# Patient Record
Sex: Male | Born: 1985 | Race: White | Hispanic: No | Smoking: Current every day smoker
Health system: Southern US, Community
[De-identification: ages and names within clinical notes are randomized; demographics above are authoritative.]

---

## 2017-08-27 ENCOUNTER — Emergency Department (HOSPITAL_COMMUNITY): Payer: Self-pay

## 2017-08-27 ENCOUNTER — Encounter (HOSPITAL_COMMUNITY): Payer: Self-pay | Admitting: *Deleted

## 2017-08-27 ENCOUNTER — Emergency Department (HOSPITAL_COMMUNITY)
Admission: EM | Admit: 2017-08-27 | Discharge: 2017-08-27 | Disposition: A | Discharge status: Home / Self Care | Payer: Self-pay | Attending: Physician Assistant | Admitting: Physician Assistant

## 2017-08-27 DIAGNOSIS — R109 Unspecified abdominal pain: Secondary | ICD-10-CM | POA: Insufficient documentation

## 2017-08-27 DIAGNOSIS — I4891 Unspecified atrial fibrillation: Secondary | ICD-10-CM | POA: Insufficient documentation

## 2017-08-27 DIAGNOSIS — F17210 Nicotine dependence, cigarettes, uncomplicated: Secondary | ICD-10-CM | POA: Insufficient documentation

## 2017-08-27 DIAGNOSIS — R112 Nausea with vomiting, unspecified: Secondary | ICD-10-CM | POA: Insufficient documentation

## 2017-08-27 LAB — CBC WITH DIFFERENTIAL/PLATELET
BASOS ABS: 0 10*3/uL (ref 0.0–0.1)
BASOS PCT: 0 %
EOS ABS: 0.3 10*3/uL (ref 0.0–0.7)
EOS PCT: 2 %
HCT: 45.4 % (ref 39.0–52.0)
Hemoglobin: 16.1 g/dL (ref 13.0–17.0)
Lymphocytes Relative: 14 %
Lymphs Abs: 1.7 10*3/uL (ref 0.7–4.0)
MCH: 29.9 pg (ref 26.0–34.0)
MCHC: 35.5 g/dL (ref 30.0–36.0)
MCV: 84.4 fL (ref 78.0–100.0)
MONOS PCT: 11 %
Monocytes Absolute: 1.3 10*3/uL — ABNORMAL HIGH (ref 0.1–1.0)
Neutro Abs: 8.8 10*3/uL — ABNORMAL HIGH (ref 1.7–7.7)
Neutrophils Relative %: 73 %
PLATELETS: 236 10*3/uL (ref 150–400)
RBC: 5.38 MIL/uL (ref 4.22–5.81)
RDW: 12.8 % (ref 11.5–15.5)
WBC: 12.1 10*3/uL — AB (ref 4.0–10.5)

## 2017-08-27 LAB — URINALYSIS, ROUTINE W REFLEX MICROSCOPIC
BILIRUBIN URINE: NEGATIVE
Glucose, UA: NEGATIVE mg/dL
HGB URINE DIPSTICK: NEGATIVE
Ketones, ur: 20 mg/dL — AB
Leukocytes, UA: NEGATIVE
NITRITE: NEGATIVE
PH: 6 (ref 5.0–8.0)
Protein, ur: NEGATIVE mg/dL
SPECIFIC GRAVITY, URINE: 1.012 (ref 1.005–1.030)

## 2017-08-27 LAB — PROTIME-INR
INR: 0.89
Prothrombin Time: 12 seconds (ref 11.4–15.2)

## 2017-08-27 LAB — RAPID URINE DRUG SCREEN, HOSP PERFORMED
AMPHETAMINES: NOT DETECTED
BARBITURATES: NOT DETECTED
Benzodiazepines: NOT DETECTED
COCAINE: NOT DETECTED
OPIATES: NOT DETECTED
TETRAHYDROCANNABINOL: POSITIVE — AB

## 2017-08-27 LAB — COMPREHENSIVE METABOLIC PANEL
ALBUMIN: 4.3 g/dL (ref 3.5–5.0)
ALT: 14 U/L — ABNORMAL LOW (ref 17–63)
ANION GAP: 9 (ref 5–15)
AST: 20 U/L (ref 15–41)
Alkaline Phosphatase: 61 U/L (ref 38–126)
BUN: 11 mg/dL (ref 6–20)
CO2: 27 mmol/L (ref 22–32)
Calcium: 10.6 mg/dL — ABNORMAL HIGH (ref 8.9–10.3)
Chloride: 100 mmol/L — ABNORMAL LOW (ref 101–111)
Creatinine, Ser: 0.86 mg/dL (ref 0.61–1.24)
GFR calc Af Amer: >60 mL/min (ref >60)
GLUCOSE: 112 mg/dL — AB (ref 65–99)
POTASSIUM: 3.4 mmol/L — AB (ref 3.5–5.1)
Sodium: 136 mmol/L (ref 135–145)
TOTAL PROTEIN: 7.1 g/dL (ref 6.5–8.1)
Total Bilirubin: 0.8 mg/dL (ref 0.3–1.2)

## 2017-08-27 LAB — I-STAT TROPONIN, ED: TROPONIN I, POC: 0 ng/mL (ref 0.00–0.08)

## 2017-08-27 LAB — I-STAT CG4 LACTIC ACID, ED: Lactic Acid, Venous: 1.54 mmol/L (ref 0.5–1.9)

## 2017-08-27 LAB — LIPASE, BLOOD: Lipase: 25 U/L (ref 11–51)

## 2017-08-27 LAB — D-DIMER, QUANTITATIVE: D-Dimer, Quant: 0.27 ug/mL-FEU (ref 0.00–0.50)

## 2017-08-27 MED ORDER — DILTIAZEM HCL 25 MG/5ML IV SOLN
10.0000 mg | Freq: Once | INTRAVENOUS | Status: DC
  Filled 2017-08-27: qty 5

## 2017-08-27 MED ORDER — IOPAMIDOL (ISOVUE-300) INJECTION 61%
INTRAVENOUS | Status: AC
  Administered 2017-08-27: 100 mL
  Filled 2017-08-27: qty 100

## 2017-08-27 MED ORDER — DILTIAZEM HCL 100 MG IV SOLR
5.0000 mg/h | INTRAVENOUS | Status: DC
  Filled 2017-08-27: qty 100

## 2017-08-27 MED ORDER — SODIUM CHLORIDE 0.9 % IV BOLUS (SEPSIS)
1000.0000 mL | Freq: Once | INTRAVENOUS | Status: AC
  Administered 2017-08-27: 1000 mL via INTRAVENOUS

## 2017-08-27 MED ORDER — LEVALBUTEROL HCL 0.63 MG/3ML IN NEBU
0.6300 mg | INHALATION_SOLUTION | Freq: Once | RESPIRATORY_TRACT | Status: AC
  Administered 2017-08-27: 0.63 mg via RESPIRATORY_TRACT
  Filled 2017-08-27: qty 3

## 2017-08-27 NOTE — ED Provider Notes (Signed)
[facility] EMERGENCY DEPARTMENT Provider Note   CSN: [id number] Arrival date & time: 08/27/17  0909     History   Chief Complaint No chief complaint on file.   HPI Akhilesh Sassone is a 31 y.o. male.  HPI   Patient is a 31 year old male presenting with multiple complaints. Patient reports that he has abdominal pain has felt faint, has had chest pain, jaw pain, nausea, vomiting. Patient eports has not had follow-up in the last year. He reports over 20 visits to emergency department for abdominal pain workup in the past.he reports that there is some artery leading to his stomach which is mildly narrowed they think that may be causing his chronic abdominal pain. 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 He says he started vomiting this morning and felt like he "could have infection in his blood". So he called EMS to be brought to the emergency department.  On arrival patient noted to be in A. Fib. Patient denies any kind of cardiac history.  History reviewed. No pertinent past medical history.  There are no active problems to display for this patient.   History reviewed. No pertinent surgical history.     Home Medications    Prior to Admission medications   Not on File    Family History No family history on file.  Social History Social History  Substance Use Topics  . Smoking status: Current Every Day Smoker    Packs/day: 0.50    Types: Cigarettes  . Smokeless tobacco: Never Used  . Alcohol use No     Allergies   Patient has no known allergies.   Review of Systems Review of Systems  Constitutional: Positive for fatigue. Negative for activity change.  Respiratory: Positive for cough and shortness of breath.   Cardiovascular: Positive for chest pain.  Gastrointestinal: Positive for abdominal pain.  Genitourinary: Positive for dysuria.  Musculoskeletal: Negative for neck pain  and neck stiffness.  Skin: Negative for rash.  Neurological: Positive for dizziness, syncope, weakness and headaches.     Physical Exam Updated Vital Signs BP 107/63   Pulse 69   Temp 98.3 F (36.8 C) (Oral)   Resp 14   SpO2 96%   Physical Exam  Constitutional: He is oriented to person, place, and time. He appears well-developed and well-nourished.  Diffuse tattoos.  HENT:  Head: Normocephalic and atraumatic.  Eyes: Conjunctivae are normal. Right eye exhibits no discharge. Left eye exhibits no discharge.  Neck: Neck supple.  Cardiovascular:  No murmur heard. Irregularly irregular nno murmurs.  Pulmonary/Chest: Effort normal. No respiratory distress.  Course breath sounds with scattered wheezes.  Abdominal: Soft. There is tenderness.  Diffuse tenderness nonfocal.  Musculoskeletal: He exhibits no edema.  Neurological: He is alert and oriented to person, place, and time. No cranial nerve deficit.  Skin: Skin is warm and dry.  Psychiatric:  Patient anxious  Nursing note and vitals reviewed.    ED Treatments / Results  Labs (all labs ordered are listed, but only abnormal results are displayed) Labs Reviewed  COMPREHENSIVE METABOLIC PANEL - Abnormal; Notable for the following:       Result Value   Potassium 3.4 (*)    Chloride 100 (*)    Glucose, Bld 112 (*)    Calcium 10.6 (*)    ALT 14 (*)    All other components within normal limits  CBC WITH DIFFERENTIAL/PLATELET - Abnormal; Notable for  the following:    WBC 12.1 (*)    Neutro Abs 8.8 (*)    Monocytes Absolute 1.3 (*)    All other components within normal limits  URINALYSIS, ROUTINE W REFLEX MICROSCOPIC - Abnormal; Notable for the following:    Ketones, ur 20 (*)    All other components within normal limits  RAPID URINE DRUG SCREEN, HOSP PERFORMED - Abnormal; Notable for the following:    Tetrahydrocannabinol POSITIVE (*)    All other components within normal limits  PROTIME-INR  LIPASE, BLOOD  D-DIMER,  QUANTITATIVE (NOT AT [facility])  I-STAT CG4 LACTIC ACID, ED  I-STAT TROPONIN, ED    EKG  EKG Interpretation  Date/Time:  Wednesday August 27 2017 09:09:45 EDT Ventricular Rate:  158 PR Interval:    QRS Duration: 86 QT Interval:  284 QTC Calculation: 461 R Axis:   95 Text Interpretation:  Atrial fibrillation Borderline right axis deviation Atrial fibrillation Confirmed by Corlis Leak, Joana Nolton ([id number]) on 08/27/2017 9:36:30 AM       Radiology Ct Abdomen Pelvis W Contrast  Result Date: 08/27/2017 CLINICAL DATA:  Abdominal pain, nausea/vomiting EXAM: CT ABDOMEN AND PELVIS WITH CONTRAST TECHNIQUE: Multidetector CT imaging of the abdomen and pelvis was performed using the standard protocol following bolus administration of intravenous contrast. CONTRAST:  100 mL Isovue 300 IV COMPARISON:  None. FINDINGS: Lower chest: Lung bases are clear. Hepatobiliary: Liver is within normal limits. Gallbladder is underdistended but unremarkable. Pancreas: Within normal limits. Spleen: Within normal limits. Adrenals/Urinary Tract: Adrenal glands are within normal limits. Kidneys within normal limits.  No hydronephrosis. Bladder is underdistended but unremarkable. Stomach/Bowel: Stomach is within normal limits. No evidence bowel obstruction. Normal appendix (coronal image 45). Vascular/Lymphatic: No evidence of abdominal aortic aneurysm. Celiac artery and SMA are patent. No findings to suggest median arcuate ligament compression. Circumaortic left renal vein. No suspicious abdominopelvic lymphadenopathy. Reproductive: Prostate gland is notable for mildly heterogeneous enhancement. Other: No abdominopelvic ascites. Musculoskeletal: Visualized osseous structures are within normal limits. IMPRESSION: Negative CT abdomen/ pelvis. No evidence of bowel obstruction.  Normal appendix. Electronically Signed   By: Charline Bills M.D.   On: 08/27/2017 11:56   Dg Chest Portable 1 View  Result Date: 08/27/2017 CLINICAL DATA:   Recurrent syncopal episodes over the past 4 days. Cough, congestion shortness of breath. EXAM: PORTABLE CHEST 1 VIEW COMPARISON:  None. FINDINGS: Lungs are clear. Heart size is normal. No pneumothorax or pleural effusion. No bony abnormality. Defibrillator pads are noted. IMPRESSION: Negative chest. Electronically Signed   By: Drusilla Kanner M.D.   On: 08/27/2017 09:45    Procedures Procedures (including critical care time)  CRITICAL CARE Performed by: Arlana Hove Total critical care time: 45 minutes Critical care time was exclusive of separately billable procedures and treating other patients. Critical care was necessary to treat or prevent imminent or life-threatening deterioration. Critical care was time spent personally by me on the following activities: development of treatment plan with patient and/or surrogate as well as nursing, discussions with consultants, evaluation of patient's response to treatment, examination of patient, obtaining history from patient or surrogate, ordering and performing treatments and interventions, ordering and review of laboratory studies, ordering and review of radiographic studies, pulse oximetry and re-evaluation of patient's condition.    Medications Ordered in ED Medications  diltiazem (CARDIZEM) 100 mg in dextrose 5 % 100 mL (1 mg/mL) infusion (5 mg/hr Intravenous Not Given 08/27/17 1338)  sodium chloride 0.9 % bolus 1,000 mL (0 mLs Intravenous Stopped 08/27/17 0945)  sodium  chloride 0.9 % bolus 1,000 mL (0 mLs Intravenous Stopped 08/27/17 1338)  levalbuterol (XOPENEX) nebulizer solution 0.63 mg (0.63 mg Nebulization Given 08/27/17 1028)  iopamidol (ISOVUE-300) 61 % injection (100 mLs  Contrast Given 08/27/17 1117)     Initial Impression / Assessment and Plan / ED Course  I have reviewed the triage vital signs and the nursing notes.  Pertinent labs & imaging results that were available during my care of the patient were reviewed by me  and considered in my medical decision making (see chart for details).     Patient is a 31 year old male presenting with multiple complaints. Patient reports that he has abdominal pain has felt faint, has had chest pain, jaw pain, nausea, vomiting. Patient eports has not had follow-up in the last year. He reports over 20 visits to emergency department for abdominal pain workup in the past.he reports that there is some artery leading to his stomach which is mildly narrowed they think that may be causing his chronic abdominal pain. 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 He says he started vomiting this morning and felt like he "could have infection in his blood". So he called EMS to be brought to the emergency department.  On arrival patient noted to be in A. Fib. Patient denies any kind of cardiac history.   9:36 AM Patient has pan positive on review of systems is difficult to nail down what exactly brought him in today. However patient does have A. Fib with RVR on arrival. EMS reported a heart rate of 220. On arrival here is more like 130s to 160s. We will start dilt drip given initial blood pressure of 80s over 40s. Blood pressure improved to 130/80 with just 200 mL of fluid  1:10 PM  Pt now in sinus rtyhym. After only receiving fluids.  Unsure this patient is going in and out of A. Fib which is causing him to have these feelings of lightheadedness at home.   Patient has no cardiac risk factors ChadS vasc score 0. We'll touch base with cardiology.  Discussed cardiology. Will not give any aspirin or anicaogulation. We'll have him follow up with cardiology as an outpatient.  Final Clinical Impressions(s) / ED Diagnoses   Final diagnoses:  None    New Prescriptions New Prescriptions   No medications on file     Abelino DerrickMackuen, Caelum Federici Lyn, MD 08/27/17 1352

## 2017-08-27 NOTE — ED Notes (Signed)
HR 80 and irregular, MacKuen, MD informed, plan to hold Cardiazem until post infusion of 2nd NS bolus, will continue to monitor

## 2017-08-27 NOTE — ED Triage Notes (Signed)
[facility], per report pt has been sick x5-7 days, reports chills when  He lost power over the weekend and has been vomiting with one episode of dark blood in emesis, pt reports near syncopal episodes chronically, and x5 syncopal episodes of syncope resulting in falls and hitting his head, pt MAE, pt HR 170-200 pta, pt arrives to ED hypotensive, pt A&O x4

## 2017-08-27 NOTE — Discharge Instructions (Signed)
Please follow up with cardiology. They may want to be on a Holter monitor and do an echo of your heart. Please follow-up with  a primary care physician. We'll provide any phone numbers for each.

## 2017-09-01 ENCOUNTER — Telehealth (HOSPITAL_COMMUNITY): Payer: Self-pay | Admitting: *Deleted

## 2017-09-01 NOTE — Telephone Encounter (Signed)
I contacted pt to offer follow up to afib clinic since in the ED for afib with RVR.   Pt declined appt stating that he has no insurance.  I advised pt importance of [facility] just to make sure his is still in rhythm and to make sure he is no longer dehydrated.  Pt was given fluids in the hospital.  Pt understood but was dismissive that this would be an issue

## 2019-03-17 IMAGING — CT CT ABD-PELV W/ CM
2 of 4 series · 16 of 46 positions shown, 18 images · IV contrast (isovue)
Comparison: None.

CLINICAL DATA: Abdominal pain, nausea/vomiting

EXAM:
CT ABDOMEN AND PELVIS WITH CONTRAST
TECHNIQUE: Multidetector CT imaging of the abdomen and pelvis was performed
using the standard protocol following bolus administration of
intravenous contrast.
CONTRAST:  100 mL Isovue 300 IV

[Series 3: a/p w/ 5mm · axial · 0.76mm/px · z∈[+761,+1171]mm · 13 of 90 slices shown, 15 images]
[im 4/90  soft-tissue]
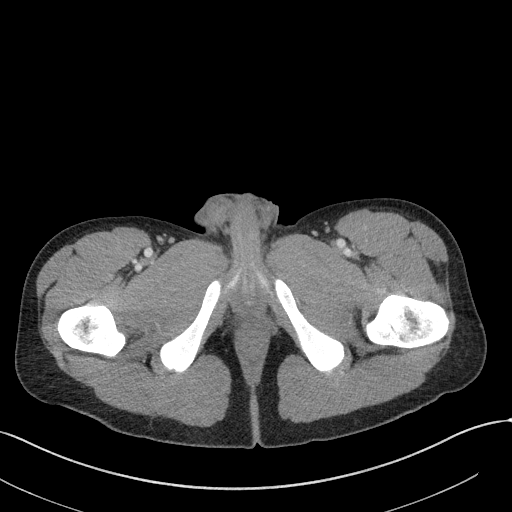
[im 4/90  bone]
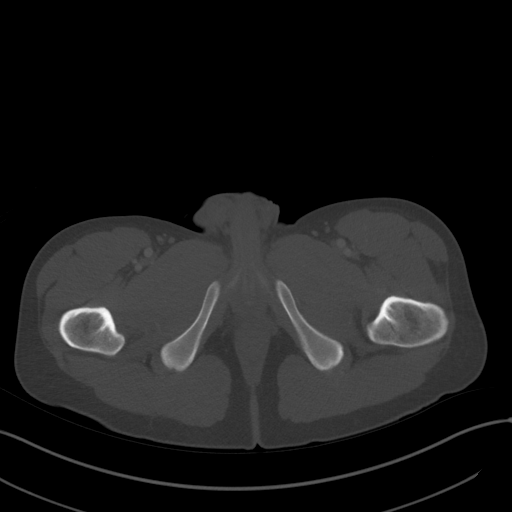
[im 12/90  soft-tissue]
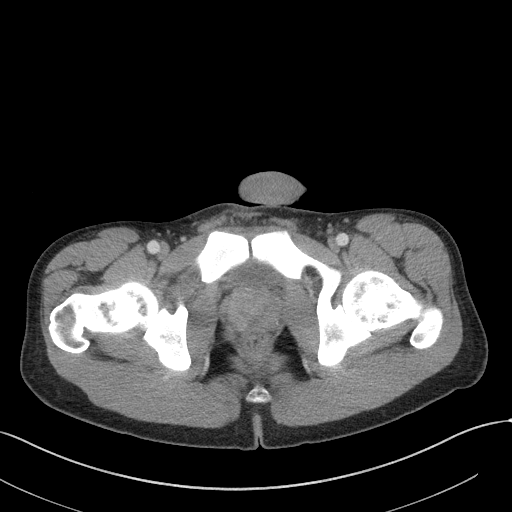
[im 19/90  soft-tissue]
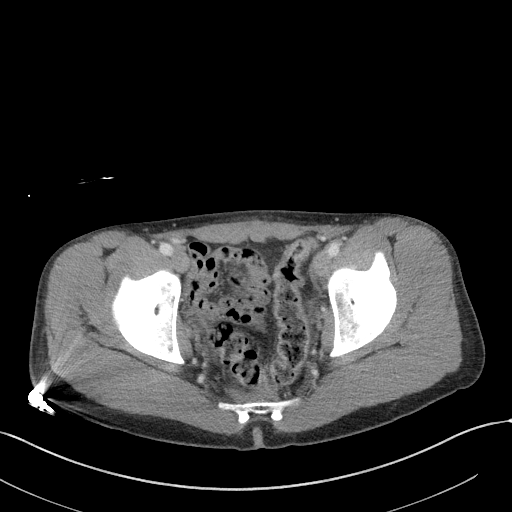
[im 26/90  soft-tissue]
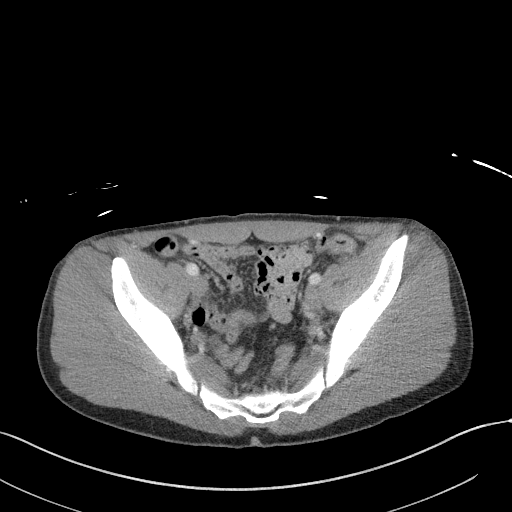
[im 30/90  soft-tissue]
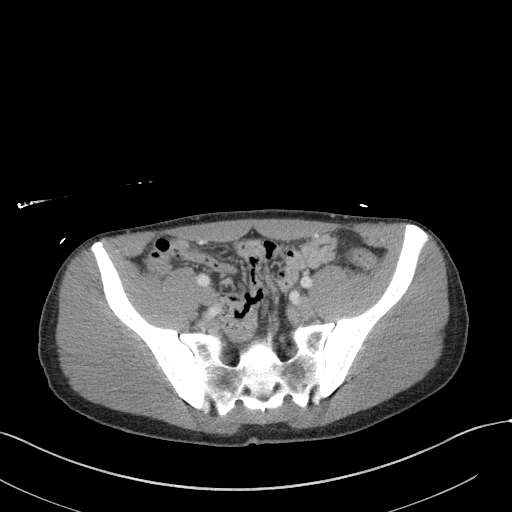
[im 38/90  soft-tissue]
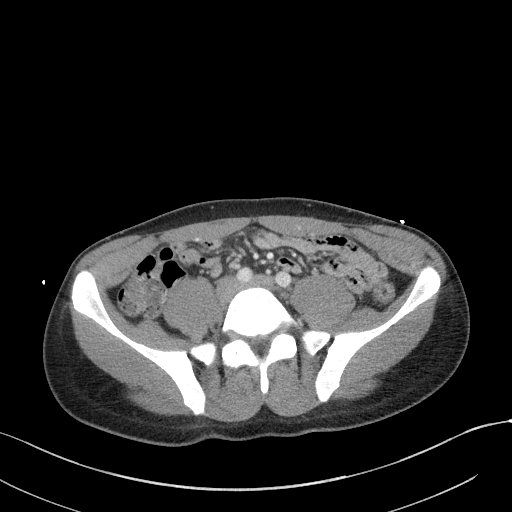
[im 45/90  soft-tissue]
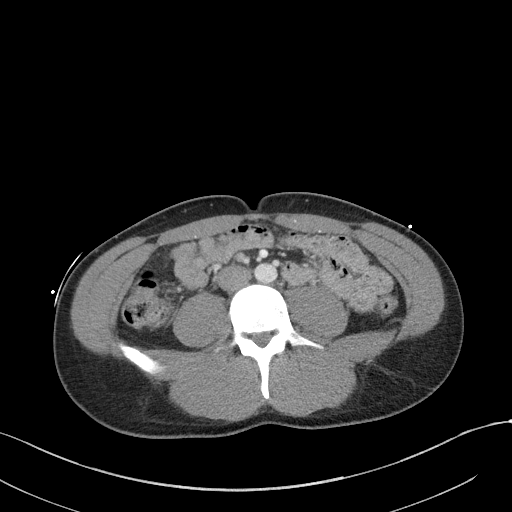
[im 52/90  soft-tissue]
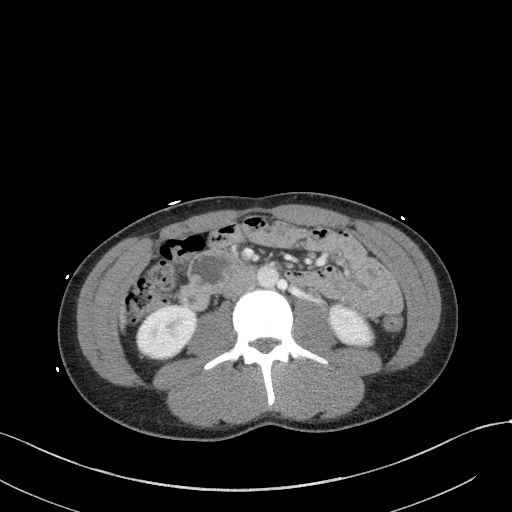
[im 60/90  soft-tissue]
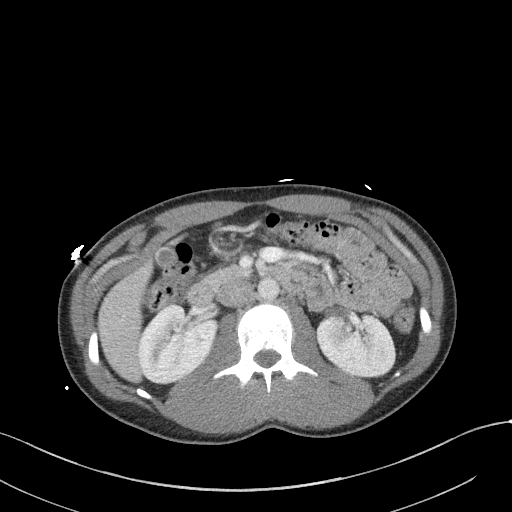
[im 60/90  bone]
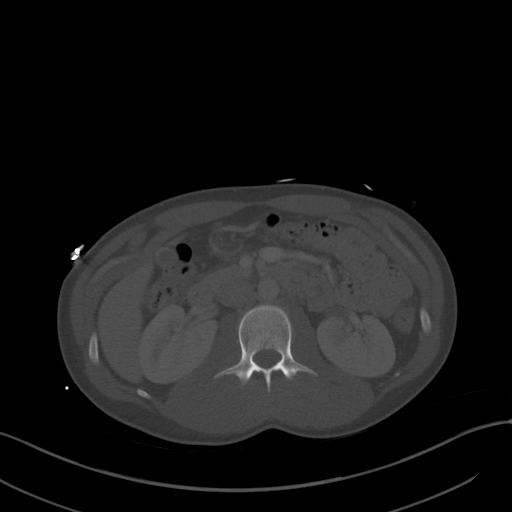
[im 64/90  soft-tissue]
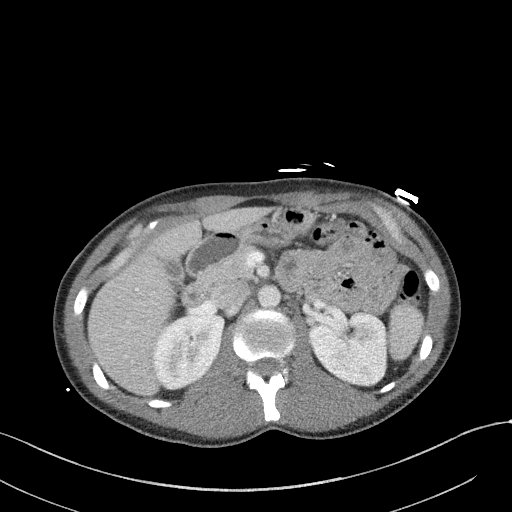
[im 71/90  soft-tissue]
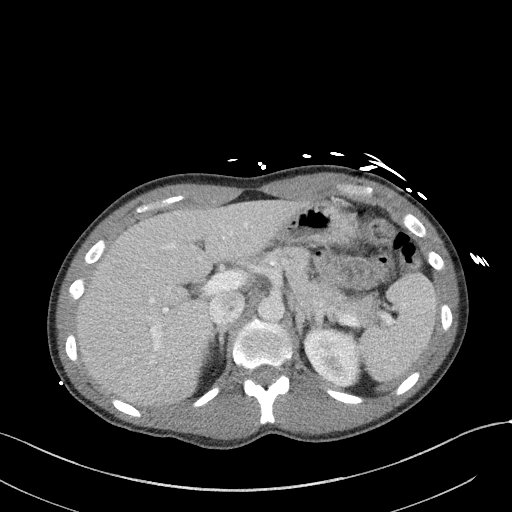
[im 78/90  soft-tissue]
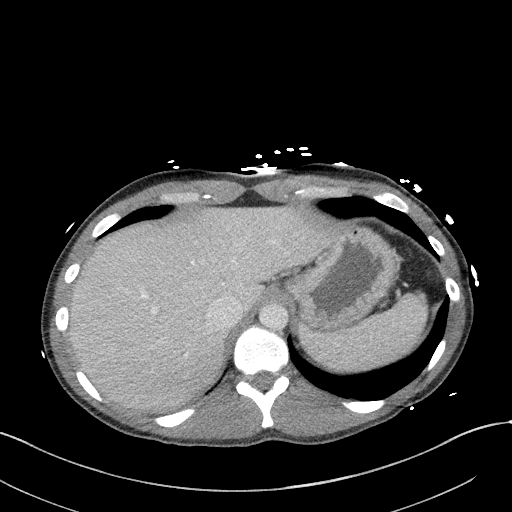
[im 86/90  soft-tissue]
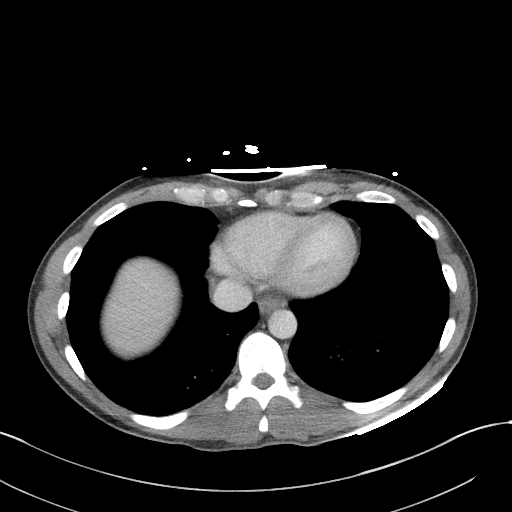

[Series 6: a/p w/ cor · coronal · 0.65mm/px · 3 of 107 slices shown]
[im 36/107  soft-tissue]
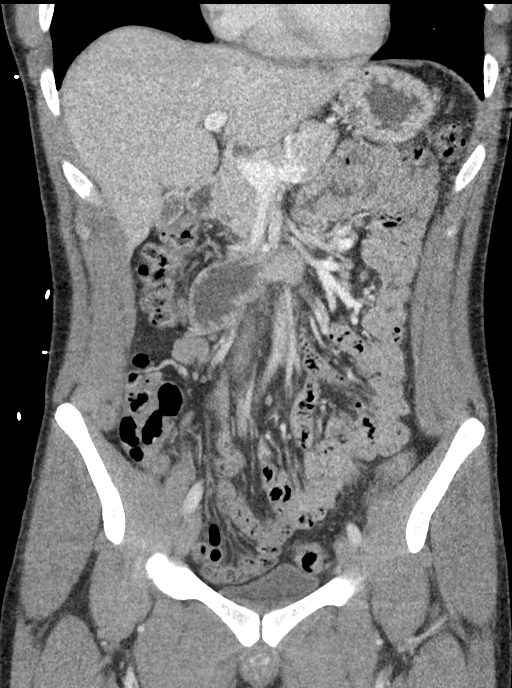
[im 48/107  soft-tissue]
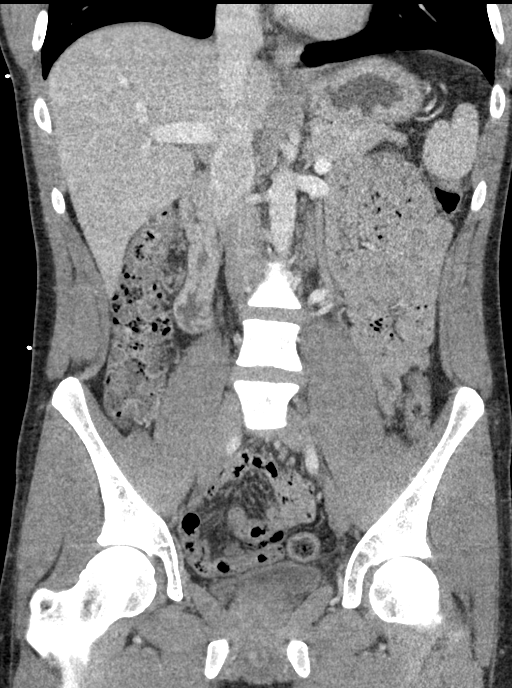
[im 59/107  soft-tissue]
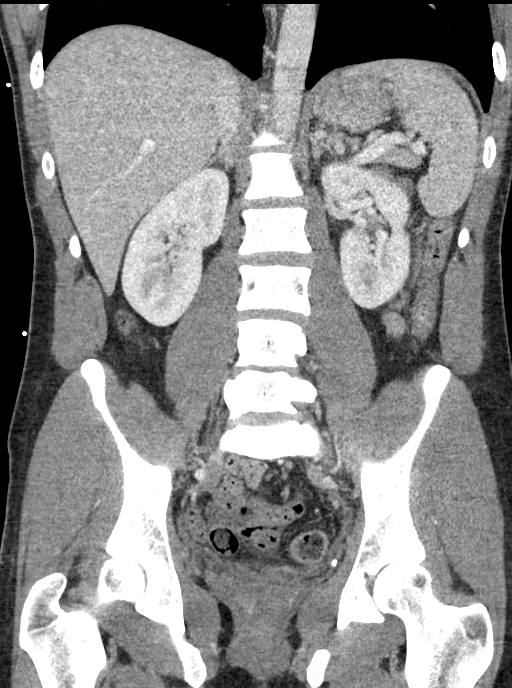

[16 of 46 positions shown; findings below may reference images not displayed]

FINDINGS: Lower chest: Lung bases are clear.

Hepatobiliary: Liver is within normal limits.

Gallbladder is underdistended but unremarkable.

Pancreas: Within normal limits.

Spleen: Within normal limits.

Adrenals/Urinary Tract: Adrenal glands are within normal limits.

Kidneys within normal limits.  No hydronephrosis.

Bladder is underdistended but unremarkable.

Stomach/Bowel: Stomach is within normal limits.

No evidence bowel obstruction.

Normal appendix (coronal image 45).

Vascular/Lymphatic: No evidence of abdominal aortic aneurysm.

Celiac artery and SMA are patent. No findings to suggest median
arcuate ligament compression.

Circumaortic left renal vein.

No suspicious abdominopelvic lymphadenopathy.

Reproductive: Prostate gland is notable for mildly heterogeneous
enhancement.

Other: No abdominopelvic ascites.

Musculoskeletal: Visualized osseous structures are within normal
limits.
IMPRESSION: Negative CT abdomen/ pelvis.

No evidence of bowel obstruction.  Normal appendix.
